# Patient Record
Sex: Male | Born: 2000 | Hispanic: No | Marital: Single | Smoking: Never smoker
Health system: Southern US, Community
[De-identification: ages and names within clinical notes are randomized; demographics above are authoritative.]

## PROBLEM LIST (undated history)

## (undated) ENCOUNTER — Ambulatory Visit (HOSPITAL_COMMUNITY): Source: Home / Self Care

## (undated) DIAGNOSIS — T7840XA Allergy, unspecified, initial encounter: Secondary | ICD-10-CM

---
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
## 2018-01-21 DIAGNOSIS — H10013 Acute follicular conjunctivitis, bilateral: Secondary | ICD-10-CM | POA: Diagnosis not present

## 2018-12-11 DIAGNOSIS — G44209 Tension-type headache, unspecified, not intractable: Secondary | ICD-10-CM | POA: Diagnosis not present

## 2018-12-11 DIAGNOSIS — H40033 Anatomical narrow angle, bilateral: Secondary | ICD-10-CM | POA: Diagnosis not present

## 2018-12-26 DIAGNOSIS — H5213 Myopia, bilateral: Secondary | ICD-10-CM | POA: Diagnosis not present

## 2019-04-18 ENCOUNTER — Encounter (HOSPITAL_COMMUNITY): Payer: Self-pay

## 2019-04-18 ENCOUNTER — Ambulatory Visit (HOSPITAL_COMMUNITY)
Admission: EM | Admit: 2019-04-18 | Discharge: 2019-04-18 | Disposition: A | Discharge status: Home / Self Care | Payer: Medicaid Other | Attending: Family Medicine | Admitting: Family Medicine

## 2019-04-18 ENCOUNTER — Other Ambulatory Visit: Payer: Self-pay

## 2019-04-18 DIAGNOSIS — J302 Other seasonal allergic rhinitis: Secondary | ICD-10-CM

## 2019-04-18 DIAGNOSIS — L700 Acne vulgaris: Secondary | ICD-10-CM

## 2019-04-18 HISTORY — DX: Allergy, unspecified, initial encounter: T78.40XA

## 2019-04-18 MED ORDER — LORATADINE 10 MG PO TABS: 10.0000 mg | ORAL_TABLET | Freq: Every day | ORAL | 0 refills | Status: DC

## 2019-04-18 MED ORDER — CLINDAMYCIN PHOSPHATE 1 % EX SOLN: Freq: Two times a day (BID) | CUTANEOUS | 1 refills | Status: DC

## 2019-04-18 NOTE — ED Triage Notes (Signed)
Pt presents for allergy medication to help  With recurring sneezing and post nasal drainage.

## 2019-04-21 NOTE — ED Provider Notes (Signed)
[facility]   [id number] 04/18/19 Arrival Time: 1141  ASSESSMENT & PLAN:  1. Seasonal allergies   2. Acne vulgaris      Meds ordered this encounter  Medications  . loratadine (CLARITIN) 10 MG tablet    Sig: Take 1 tablet (10 mg total) by mouth daily.    Dispense:  30 tablet    Refill:  0  . clindamycin (CLEOCIN T) 1 % external solution    Sig: Apply topically 2 (two) times daily.    Dispense:  30 mL    Refill:  1    Reviewed expectations re: course of current medical issues. Questions answered. Outlined signs and symptoms indicating need for more acute intervention. Patient verbalized understanding. After Visit Summary given.   SUBJECTIVE: History from: patient. Jared Richardson is a 18 y.o. male who presents requesting medication refill. No current concerns. Seasonal allergies. Claritin works well. Also with facial acne. Requests Rx. No fevers or recent illnesses. No COVID exposures.  Current medical problems include: Past Medical History:  Diagnosis Date  . Allergy           Current Outpatient Medications (Respiratory):  .  loratadine (CLARITIN) 10 MG tablet, Take 1 tablet (10 mg total) by mouth daily.       Current Outpatient Medications (Other):  .  clindamycin (CLEOCIN T) 1 % external solution, Apply topically 2 (two) times daily. No current facility-administered medications for this encounter.  No current facility-administered medications for this encounter.  Current Outpatient Medications:  .  clindamycin (CLEOCIN T) 1 % external solution, Apply topically 2 (two) times daily., Disp: 30 mL, Rfl: 1 .  loratadine (CLARITIN) 10 MG tablet, Take 1 tablet (10 mg total) by mouth daily., Disp: 30 tablet, Rfl: 0   ROS: As per HPI.   OBJECTIVE:  Vitals:   04/18/19 1225  BP: 134/72  Pulse: 75  Resp: 18  Temp: 98.2 F (36.8 C)  TempSrc: Oral  SpO2: 100%    General appearance: alert; no distress Eyes: PERRLA; EOMI; conjunctiva normal  HENT: normocephalic; atraumatic; mild nasal congestion Lungs: clear to auscultation bilaterally Heart: regular rate and rhythm Back: no CVA tenderness Extremities: no cyanosis or edema; symmetrical with no gross deformities Skin: warm and dry; facial acne Neurologic: normal gait; normal symmetric reflexes Psychological: alert and cooperative; normal mood and affect   No Known Allergies  Social History   Socioeconomic History  . Marital status: Single    Spouse name: Not on file  . Number of children: Not on file  . Years of education: Not on file  . Highest education level: Not on file  Occupational History  . Not on file  Tobacco Use  . Smoking status: Never Smoker  . Smokeless tobacco: Never Used  Substance and Sexual Activity  . Alcohol use: No  . Drug use: Not on file  . Sexual activity: Not on file  Other Topics Concern  . Not on file  Social History Narrative  . Not on file   Social Determinants of Health   Financial Resource Strain:   . Difficulty of Paying Living Expenses: Not on file  Food Insecurity:   . Worried About Programme researcher, broadcasting/film/video in the Last Year: Not on file  . Ran Out of Food in the Last Year: Not on file  Transportation Needs:   . Lack of Transportation (Medical): Not on file  . Lack of Transportation (Non-Medical): Not on file  Physical Activity:   . Days of Exercise  per Week: Not on file  . Minutes of Exercise per Session: Not on file  Stress:   . Feeling of Stress : Not on file  Social Connections:   . Frequency of Communication with Friends and Family: Not on file  . Frequency of Social Gatherings with Friends and Family: Not on file  . Attends Religious Services: Not on file  . Active Member of Clubs or Organizations: Not on file  . Attends Archivist Meetings: Not on file  . Marital Status: Not on file  Intimate Partner Violence:   . Fear of Current or Ex-Partner: Not on file  . Emotionally Abused: Not on file  .  Physically Abused: Not on file  . Sexually Abused: Not on file   Family History  Problem Relation Age of Onset  . Hepatitis Mother        chronic active infection   History reviewed. No pertinent surgical history.   Vanessa Kick, MD 04/21/19 [id number]

## 2019-08-17 DIAGNOSIS — H1013 Acute atopic conjunctivitis, bilateral: Secondary | ICD-10-CM | POA: Diagnosis not present

## 2019-09-23 DIAGNOSIS — H04123 Dry eye syndrome of bilateral lacrimal glands: Secondary | ICD-10-CM | POA: Diagnosis not present

## 2019-12-09 ENCOUNTER — Other Ambulatory Visit: Payer: Self-pay

## 2019-12-09 ENCOUNTER — Ambulatory Visit (HOSPITAL_COMMUNITY)
Admission: EM | Admit: 2019-12-09 | Discharge: 2019-12-09 | Disposition: A | Discharge status: Home / Self Care | Payer: Medicaid Other | Attending: Urgent Care | Admitting: Urgent Care

## 2019-12-09 ENCOUNTER — Ambulatory Visit (INDEPENDENT_AMBULATORY_CARE_PROVIDER_SITE_OTHER): Payer: Medicaid Other

## 2019-12-09 DIAGNOSIS — S52572A Other intraarticular fracture of lower end of left radius, initial encounter for closed fracture: Secondary | ICD-10-CM

## 2019-12-09 DIAGNOSIS — S6992XA Unspecified injury of left wrist, hand and finger(s), initial encounter: Secondary | ICD-10-CM

## 2019-12-09 MED ORDER — IBUPROFEN 800 MG PO TABS
ORAL_TABLET | ORAL | Status: AC
  Filled 2019-12-09: qty 1

## 2019-12-09 MED ORDER — IBUPROFEN 800 MG PO TABS: 800.0000 mg | ORAL_TABLET | Freq: Three times a day (TID) | ORAL | 0 refills | Status: DC

## 2019-12-09 MED ORDER — IBUPROFEN 800 MG PO TABS
800.0000 mg | ORAL_TABLET | Freq: Once | ORAL | Status: AC
  Administered 2019-12-09: 21:00:00 800 mg via ORAL

## 2019-12-09 NOTE — ED Triage Notes (Signed)
L wrist deformity from fall off skateboard

## 2019-12-09 NOTE — ED Provider Notes (Signed)
[facility]    CSN: [id number] Arrival date & time: 12/09/19  1854      History   Chief Complaint Chief Complaint  Patient presents with  . Wrist Pain    HPI Jared Richardson is a 19 y.o. male.   [name] presents with complaints of left wrist pain and deformity. He was skate boarding on a ramp, just prior to arrival, fell, and caught himself with his outstretched left arm. He is right handed. Pain since. No previous wrist injury. No numbness or tingling. Pain with flexion and extension. Hasn't taken any medications for pain.   ROS per HPI, negative if not otherwise mentioned.      Past Medical History:  Diagnosis Date  . Allergy     Patient Active Problem List   Diagnosis Date Noted  . Overweight child 05/07/2012  . Exposure to hepatitis B 01/14/2012    No past surgical history on file.     Home Medications    Prior to Admission medications   Medication Sig Start Date End Date Taking? Authorizing Provider  ibuprofen (ADVIL) 800 MG tablet Take 1 tablet (800 mg total) by mouth 3 (three) times daily. 12/09/19   Georgetta Haber, NP    Family History Family History  Problem Relation Age of Onset  . Hepatitis Mother        chronic active infection    Social History Social History   Tobacco Use  . Smoking status: Never Smoker  . Smokeless tobacco: Never Used  Substance Use Topics  . Alcohol use: No  . Drug use: Not on file     Allergies   Patient has no known allergies.   Review of Systems Review of Systems   Physical Exam Triage Vital Signs ED Triage Vitals [12/09/19 2005]  Enc Vitals Group     BP 120/77     Pulse Rate 87     Resp 16     Temp 98.1 F (36.7 C)     Temp src      SpO2 99 %     Weight      Height      Head Circumference      Peak Flow      Pain Score 8     Pain Loc      Pain Edu?      Excl. in [place]?    No data found.  Updated Vital Signs BP 120/77   Pulse 87   Temp 98.1 F (36.7 C)   Resp 16   SpO2 99%     Physical Exam Constitutional:      Appearance: He is well-developed.  Cardiovascular:     Rate and Rhythm: Normal rate.  Pulmonary:     Effort: Pulmonary effort is normal.  Musculoskeletal:     Left wrist: Swelling, deformity, tenderness and bony tenderness present. No effusion, lacerations or crepitus. Decreased range of motion. Normal pulse.     Comments: Swelling and deformity noted to distal radius with tenderness on palpation; cap refill < 2 seconds    Skin:    General: Skin is warm and dry.  Neurological:     Mental Status: He is alert and oriented to person, place, and time.      UC Treatments / Results  Labs (all labs ordered are listed, but only abnormal results are displayed) Labs Reviewed - No data to display  EKG   Radiology DG Wrist Complete Left  Result Date: 12/09/2019 CLINICAL DATA:  Fell off skateboard EXAM: LEFT WRIST - COMPLETE 3+ VIEW COMPARISON:  None. FINDINGS: Acute nondisplaced intra-articular fracture involving the distal radius. No subluxation. Positive for soft tissue swelling. IMPRESSION: Acute nondisplaced intra-articular distal radius fracture. Electronically Signed   By: Jasmine Pang M.D.   On: 12/09/2019 20:28    Procedures Procedures (including critical care time)  Medications Ordered in UC Medications  ibuprofen (ADVIL) tablet 800 mg (800 mg Oral Given 12/09/19 2040)    Initial Impression / Assessment and Plan / UC Course  I have reviewed the triage vital signs and the nursing notes.  Pertinent labs & imaging results that were available during my care of the patient were reviewed by me and considered in my medical decision making (see chart for details).     Splint placed. Ibuprofen provided for mild pain- worse with movement. Neurovascularly intact. Follow up with orthopedics recommended. Patient verbalized understanding and agreeable to plan.   Final Clinical Impressions(s) / UC Diagnoses   Final diagnoses:  Other closed  intra-articular fracture of distal end of left radius, initial encounter     Discharge Instructions     You have a distal radial fracture.  Wear the splint at all times until you are seen by orthopedics.  Ice, elevation, ibuprofen for pain.  Call orthopedics, [facility], on Monday for a follow up appointment in the next two weeks for definitive treatment.    ED Prescriptions    Medication Sig Dispense Auth. Provider   ibuprofen (ADVIL) 800 MG tablet Take 1 tablet (800 mg total) by mouth 3 (three) times daily. 30 tablet Georgetta Haber, NP     PDMP not reviewed this encounter.   Georgetta Haber, NP 12/09/19 2043

## 2019-12-09 NOTE — ED Notes (Signed)
Ortho tech called for splint placement. 

## 2019-12-09 NOTE — Progress Notes (Signed)
Orthopedic Tech Progress Note Patient Details:  Jared Richardson 15-Jan-2001 [id number]  Ortho Devices Type of Ortho Device: Arm sling, Sugartong splint Ortho Device/Splint Location: LUE Ortho Device/Splint Interventions: Application, Adjustment   Post Interventions Patient Tolerated: Well Instructions Provided: Adjustment of device, Care of device   [name] E Marcus Schwandt 12/09/2019, 8:59 PM

## 2019-12-09 NOTE — Discharge Instructions (Signed)
You have a distal radial fracture.  Wear the splint at all times until you are seen by orthopedics.  Ice, elevation, ibuprofen for pain.  Call orthopedics, [facility], on Monday for a follow up appointment in the next two weeks for definitive treatment.

## 2019-12-12 ENCOUNTER — Telehealth (HOSPITAL_COMMUNITY): Payer: Self-pay | Admitting: Internal Medicine

## 2019-12-12 NOTE — Telephone Encounter (Signed)
Patient was having significant difficulty getting follow up from Dr.Gramig's office. Referral was requested. I have placed a referral order in the system so the patient can be seen.

## 2019-12-15 DIAGNOSIS — M25532 Pain in left wrist: Secondary | ICD-10-CM | POA: Diagnosis not present

## 2019-12-15 DIAGNOSIS — S52502A Unspecified fracture of the lower end of left radius, initial encounter for closed fracture: Secondary | ICD-10-CM | POA: Diagnosis not present

## 2020-01-12 DIAGNOSIS — M25532 Pain in left wrist: Secondary | ICD-10-CM | POA: Diagnosis not present

## 2020-01-26 DIAGNOSIS — M25532 Pain in left wrist: Secondary | ICD-10-CM | POA: Diagnosis not present

## 2020-02-03 DIAGNOSIS — M25532 Pain in left wrist: Secondary | ICD-10-CM | POA: Diagnosis not present

## 2020-02-09 DIAGNOSIS — M25532 Pain in left wrist: Secondary | ICD-10-CM | POA: Diagnosis not present

## 2020-05-17 ENCOUNTER — Encounter (HOSPITAL_COMMUNITY): Payer: Self-pay

## 2020-05-17 ENCOUNTER — Other Ambulatory Visit: Payer: Self-pay

## 2020-05-17 ENCOUNTER — Ambulatory Visit (HOSPITAL_COMMUNITY)
Admission: EM | Admit: 2020-05-17 | Discharge: 2020-05-17 | Disposition: A | Discharge status: Home / Self Care | Payer: Medicaid Other | Attending: Student | Admitting: Student

## 2020-05-17 DIAGNOSIS — J029 Acute pharyngitis, unspecified: Secondary | ICD-10-CM | POA: Insufficient documentation

## 2020-05-17 DIAGNOSIS — J069 Acute upper respiratory infection, unspecified: Secondary | ICD-10-CM | POA: Insufficient documentation

## 2020-05-17 DIAGNOSIS — U071 COVID-19: Secondary | ICD-10-CM | POA: Diagnosis not present

## 2020-05-17 LAB — RESP PANEL BY RT-PCR (FLU A&B, COVID) ARPGX2
Influenza A by PCR: NEGATIVE
Influenza B by PCR: NEGATIVE
SARS Coronavirus 2 by RT PCR: POSITIVE — AB

## 2020-05-17 MED ORDER — LIDOCAINE VISCOUS HCL 2 % MT SOLN: 15.0000 mL | OROMUCOSAL | 0 refills | Status: DC | PRN

## 2020-05-17 NOTE — ED Triage Notes (Signed)
Pt is here with a sore throat and headache that started last night, pt has taken OTC meds to relieve discomfort.

## 2020-05-17 NOTE — Discharge Instructions (Addendum)
-  For fevers/chills, body aches, headaches- use Tylenol and Ibuprofen. You can alternate these for maximum effect. Use up to 3000mg  Tylenol daily and 3200mg  Ibuprofen daily. Make sure to take ibuprofen with food. Check the bottle of ibuprofen/tylenol for specific dosage instructions. -For sore throat, use lidocaine mouthwash up to every 4 hours. Make sure not to eat for at least 1 hour after using this, as your mouth will be very numb and you could bite yourself.  We are currently awaiting result of your PCR covid-19 test. This typically comes back in 1-2 days. We'll call you if the result is positive. Otherwise, the result will be sent electronically to your MyChart. You can also call this clinic and ask for your result via telephone.   Please isolate at home while awaiting these results. If your test is positive for Covid-19, continue to isolate at home for 5 days if you have mild symptoms, or a total of 10 days from symptom onset if you have more severe symptoms. If you quarantine for a shorter period of time (i.e. 5 days), make sure to wear a mask until day 10 of symptoms. Treat your symptoms at home with OTC remedies like tylenol/ibuprofen, mucinex, nyquil, etc. Seek medical attention if you develop high fevers, chest pain, shortness of breath, ear pain, facial pain, etc. Make sure to get up and move around every 2-3 hours while convalescing to help prevent blood clots. Drink plenty of fluids, and rest as much as possible.

## 2020-05-17 NOTE — ED Provider Notes (Signed)
[place]    CSN: [id number] Arrival date & time: 05/17/20  3818      History   Chief Complaint Chief Complaint  Patient presents with  . Sore Throat  . Headache    HPI Jared Richardson is a 20 y.o. male Presenting for URI symptoms for 2 days. History of allergies. Presenting with sore throat, cough, and headache. OTC medications with some relief- tylenol, nyquil.  Denies fevers/chills, n/v/d, shortness of breath, chest pain, congestion, facial pain, teeth pain, loss of taste/smell, swollen lymph nodes, ear pain.  Denies worst headache of life, thunderclap headache, weakness/sensation changes in arms/legs, vision changes, shortness of breath, chest pain/pressure, photophobia, phonophobia, n/v/d.  Denies chest pain, shortness of breath, confusion, high fevers.  Fully vaccinated for covid-19.   HPI  Past Medical History:  Diagnosis Date  . Allergy     Patient Active Problem List   Diagnosis Date Noted  . Overweight child 05/07/2012  . Exposure to hepatitis B 01/14/2012    History reviewed. No pertinent surgical history.     Home Medications    Prior to Admission medications   Medication Sig Start Date End Date Taking? Authorizing Provider  lidocaine (XYLOCAINE) 2 % solution Use as directed 15 mLs in the mouth or throat as needed for mouth pain. 05/17/20  Yes Hazel Sams, PA-C  ibuprofen (ADVIL) 800 MG tablet Take 1 tablet (800 mg total) by mouth 3 (three) times daily. 12/09/19   Zigmund Gottron, NP    Family History Family History  Problem Relation Age of Onset  . Hepatitis Mother        chronic active infection  . Healthy Father     Social History Social History   Tobacco Use  . Smoking status: Never Smoker  . Smokeless tobacco: Never Used  Substance Use Topics  . Alcohol use: Not Currently     Allergies   Patient has no known allergies.   Review of Systems Review of Systems  Constitutional: Negative for appetite change, chills and  fever.  HENT: Positive for sore throat. Negative for congestion, ear pain, rhinorrhea, sinus pressure and sinus pain.   Eyes: Negative for redness and visual disturbance.  Respiratory: Positive for cough. Negative for chest tightness, shortness of breath and wheezing.   Cardiovascular: Negative for chest pain and palpitations.  Gastrointestinal: Negative for abdominal pain, constipation, diarrhea, nausea and vomiting.  Genitourinary: Negative for dysuria, frequency and urgency.  Musculoskeletal: Negative for myalgias.  Neurological: Positive for headaches. Negative for dizziness and weakness.  Psychiatric/Behavioral: Negative for confusion.  All other systems reviewed and are negative.    Physical Exam Triage Vital Signs ED Triage Vitals  Enc Vitals Group     BP 05/17/20 1135 120/79     Pulse Rate 05/17/20 1135 84     Resp 05/17/20 1135 17     Temp 05/17/20 1135 99.1 F (37.3 C)     Temp Source 05/17/20 1135 Oral     SpO2 05/17/20 1135 100 %     Weight --      Height --      Head Circumference --      Peak Flow --      Pain Score 05/17/20 1133 0     Pain Loc --      Pain Edu? --      Excl. in [place]? --    No data found.  Updated Vital Signs BP 120/79 (BP Location: Right Arm)   Pulse 84  Temp 99.1 F (37.3 C) (Oral)   Resp 17   SpO2 100%   Visual Acuity Right Eye Distance:   Left Eye Distance:   Bilateral Distance:    Right Eye Near:   Left Eye Near:    Bilateral Near:     Physical Exam Vitals reviewed.  Constitutional:      General: He is not in acute distress.    Appearance: Normal appearance. He is not ill-appearing.  HENT:     Head: Normocephalic and atraumatic.     Right Ear: Hearing, tympanic membrane, ear canal and external ear normal. No swelling or tenderness. There is no impacted cerumen. No mastoid tenderness. Tympanic membrane is not perforated, erythematous, retracted or bulging.     Left Ear: Hearing, tympanic membrane, ear canal and external  ear normal. No swelling or tenderness. There is no impacted cerumen. No mastoid tenderness. Tympanic membrane is not perforated, erythematous, retracted or bulging.     Nose:     Right Sinus: No maxillary sinus tenderness or frontal sinus tenderness.     Left Sinus: No maxillary sinus tenderness or frontal sinus tenderness.     Mouth/Throat:     Mouth: Mucous membranes are moist.     Pharynx: Uvula midline. Posterior oropharyngeal erythema present. No oropharyngeal exudate.     Tonsils: No tonsillar exudate.  Cardiovascular:     Rate and Rhythm: Normal rate and regular rhythm.     Heart sounds: Normal heart sounds.  Pulmonary:     Breath sounds: Normal breath sounds and air entry. No wheezing, rhonchi or rales.  Chest:     Chest wall: No tenderness.  Abdominal:     General: Abdomen is flat. Bowel sounds are normal.     Tenderness: There is no abdominal tenderness. There is no guarding or rebound.  Lymphadenopathy:     Cervical: No cervical adenopathy.  Neurological:     General: No focal deficit present.     Mental Status: He is alert and oriented to person, place, and time.  Psychiatric:        Attention and Perception: Attention and perception normal.        Mood and Affect: Mood and affect normal.        Behavior: Behavior normal. Behavior is cooperative.        Thought Content: Thought content normal.        Judgment: Judgment normal.      UC Treatments / Results  Labs (all labs ordered are listed, but only abnormal results are displayed) Labs Reviewed - No data to display  EKG   Radiology No results found.  Procedures Procedures (including critical care time)  Medications Ordered in UC Medications - No data to display  Initial Impression / Assessment and Plan / UC Course  I have reviewed the triage vital signs and the nursing notes.  Pertinent labs & imaging results that were available during my care of the patient were reviewed by me and considered in my  medical decision making (see chart for details).     -For fevers/chills, body aches, headaches- use Tylenol and Ibuprofen. You can alternate these for maximum effect. Use up to 3000mg  Tylenol daily and 3200mg  Ibuprofen daily. Make sure to take ibuprofen with food. Check the bottle of ibuprofen/tylenol for specific dosage instructions. -For sore throat, use lidocaine mouthwash up to every 4 hours. Make sure not to eat for at least 1 hour after using this, as your mouth will be very numb and  you could bite yourself.  Covid and influenza tests sent today. Patient is fully vaccinated for covid-19. Isolation precautions per CDC guidelines until negative result. Symptomatic relief with OTC Mucinex, Nyquil, etc. Return precautions- new/worsening fevers/chills, shortness of breath, chest pain, abd pain, etc.   Centor score 0, rapid strep deferred.   Final Clinical Impressions(s) / UC Diagnoses   Final diagnoses:  Acute upper respiratory infection     Discharge Instructions     -For fevers/chills, body aches, headaches- use Tylenol and Ibuprofen. You can alternate these for maximum effect. Use up to 3000mg  Tylenol daily and 3200mg  Ibuprofen daily. Make sure to take ibuprofen with food. Check the bottle of ibuprofen/tylenol for specific dosage instructions. -For sore throat, use lidocaine mouthwash up to every 4 hours. Make sure not to eat for at least 1 hour after using this, as your mouth will be very numb and you could bite yourself.  We are currently awaiting result of your PCR covid-19 test. This typically comes back in 1-2 days. We'll call you if the result is positive. Otherwise, the result will be sent electronically to your MyChart. You can also call this clinic and ask for your result via telephone.   Please isolate at home while awaiting these results. If your test is positive for Covid-19, continue to isolate at home for 5 days if you have mild symptoms, or a total of 10 days from symptom  onset if you have more severe symptoms. If you quarantine for a shorter period of time (i.e. 5 days), make sure to wear a mask until day 10 of symptoms. Treat your symptoms at home with OTC remedies like tylenol/ibuprofen, mucinex, nyquil, etc. Seek medical attention if you develop high fevers, chest pain, shortness of breath, ear pain, facial pain, etc. Make sure to get up and move around every 2-3 hours while convalescing to help prevent blood clots. Drink plenty of fluids, and rest as much as possible.     ED Prescriptions    Medication Sig Dispense Auth. Provider   lidocaine (XYLOCAINE) 2 % solution Use as directed 15 mLs in the mouth or throat as needed for mouth pain. 100 mL , PA-C     PDMP not reviewed this encounter.   , PA-C 05/17/20 2033

## 2020-05-19 ENCOUNTER — Telehealth (HOSPITAL_COMMUNITY): Payer: Self-pay

## 2022-01-20 IMAGING — DX DG WRIST COMPLETE 3+V*L*
4 series · 4 of 4 positions shown · non-contrast
Comparison: None.

CLINICAL DATA: Fell off skateboard

EXAM:
LEFT WRIST - COMPLETE 3+ VIEW

[wrist pa]
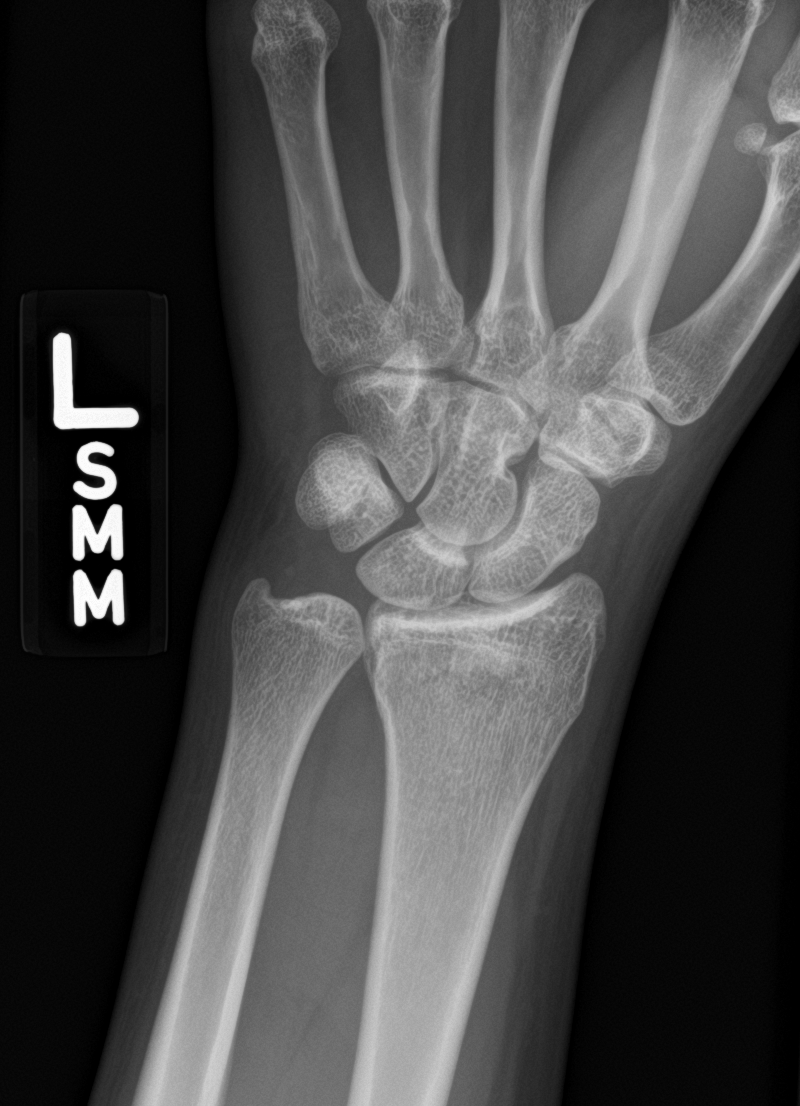

[wrist navicular]
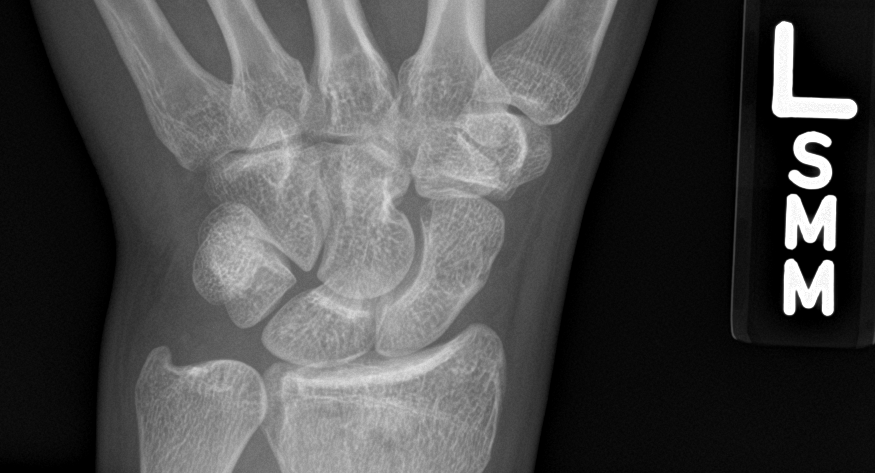

[wrist obl]
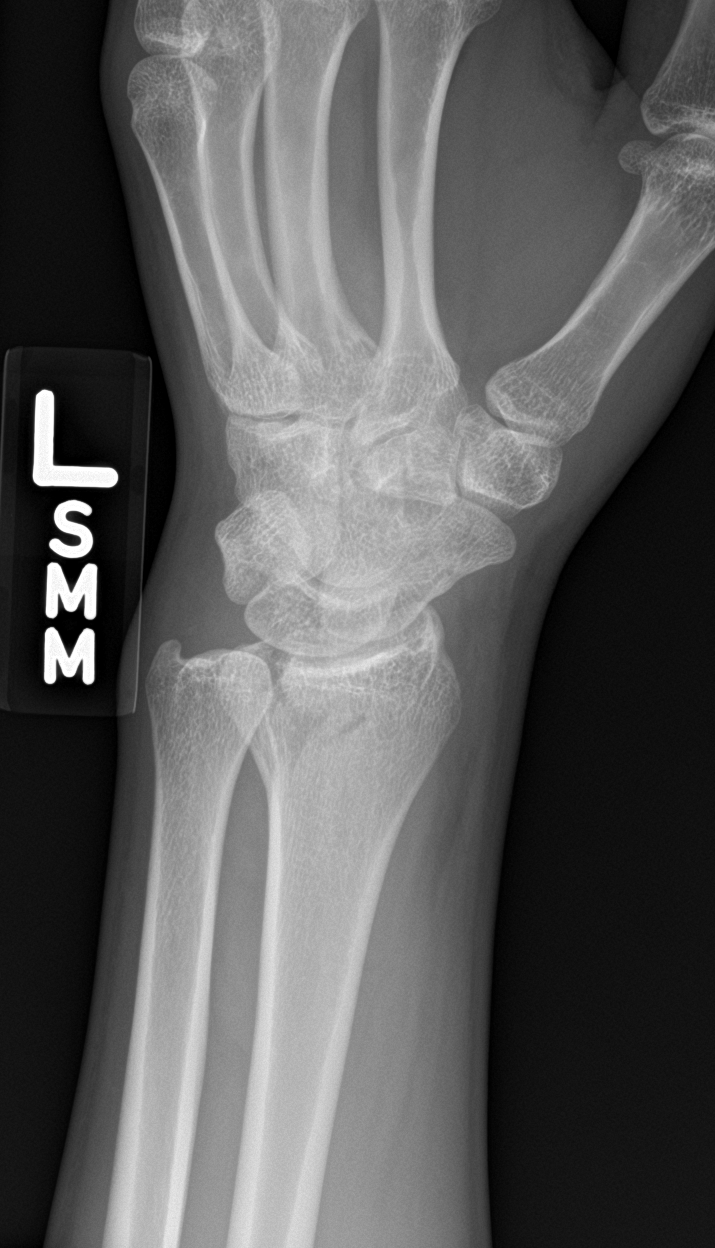

[wrist lat]
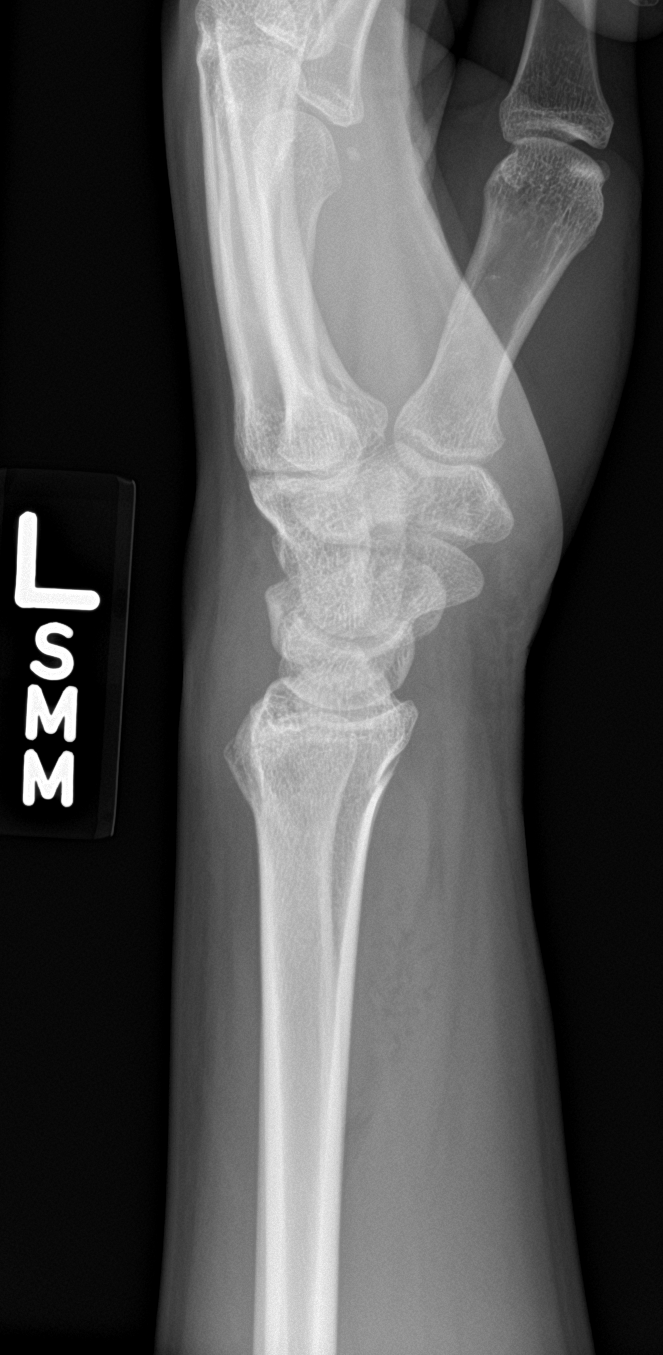

[4 of 4 positions shown; findings below may reference images not displayed]

FINDINGS: Acute nondisplaced intra-articular fracture involving the distal
radius. No subluxation. Positive for soft tissue swelling.
IMPRESSION: Acute nondisplaced intra-articular distal radius fracture.

## 2022-06-19 DIAGNOSIS — H5213 Myopia, bilateral: Secondary | ICD-10-CM | POA: Diagnosis not present

## 2023-01-31 ENCOUNTER — Encounter (HOSPITAL_COMMUNITY): Payer: Self-pay | Admitting: Emergency Medicine

## 2023-01-31 ENCOUNTER — Other Ambulatory Visit: Payer: Self-pay

## 2023-01-31 ENCOUNTER — Ambulatory Visit (HOSPITAL_COMMUNITY)
Admission: EM | Admit: 2023-01-31 | Discharge: 2023-01-31 | Disposition: A | Discharge status: Home / Self Care | Payer: 59

## 2023-01-31 DIAGNOSIS — G43809 Other migraine, not intractable, without status migrainosus: Secondary | ICD-10-CM | POA: Diagnosis not present

## 2023-01-31 NOTE — ED Triage Notes (Signed)
Prior to leaving treatment room, patient reported another concern.  When he washes his hair, a lot falls out.

## 2023-01-31 NOTE — ED Triage Notes (Signed)
Reports headache and vomiting yesterday.  States he "feels better today".  "Louann Sjogren wants me to get a check up" Denies taking any medications.  Reports he drank tea and slept it off.

## 2023-01-31 NOTE — ED Provider Notes (Signed)
[facility]    CSN: [id number] Arrival date & time: 01/31/23  1212      History   Chief Complaint Chief Complaint  Patient presents with   Headache   Letter for School/Work    HPI Jared Richardson is a 22 y.o. male.  Yesterday woke up with a migraine.  He had a couple episodes of vomiting.  He slept it off and had some tea and symptoms fully resolved. No medications taken  He reports history of migraine but does not take medicine for it.  They usually resolve on their own.  He does not have any residual symptoms.  He just needs a note to return to work since he missed yesterday.  Past Medical History:  Diagnosis Date   Allergy     Patient Active Problem List   Diagnosis Date Noted   Overweight child 05/07/2012   Exposure to hepatitis B 01/14/2012    History reviewed. No pertinent surgical history.     Home Medications    Prior to Admission medications   Medication Sig Start Date End Date Taking? Authorizing Provider  ibuprofen (ADVIL) 800 MG tablet Take 1 tablet (800 mg total) by mouth 3 (three) times daily. Patient not taking: Reported on 01/31/2023 12/09/19   Linus Mako B, NP  lidocaine (XYLOCAINE) 2 % solution Use as directed 15 mLs in the mouth or throat as needed for mouth pain. Patient not taking: Reported on 01/31/2023 05/17/20   Rhys Martini, PA-C    Family History Family History  Problem Relation Age of Onset   Hepatitis Mother        chronic active infection   Healthy Father     Social History Social History   Tobacco Use   Smoking status: Never   Smokeless tobacco: Never  Substance Use Topics   Alcohol use: Not Currently   Drug use: Never     Allergies   Patient has no known allergies.   Review of Systems Review of Systems Per HPI  Physical Exam Triage Vital Signs ED Triage Vitals  Encounter Vitals Group     BP 01/31/23 1231 113/77     Systolic BP Percentile --      Diastolic BP Percentile --      Pulse Rate  01/31/23 1231 74     Resp 01/31/23 1231 18     Temp 01/31/23 1231 98.6 F (37 C)     Temp Source 01/31/23 1231 Oral     SpO2 01/31/23 1231 97 %     Weight --      Height --      Head Circumference --      Peak Flow --      Pain Score 01/31/23 1229 0     Pain Loc --      Pain Education --      Exclude from Growth Chart --    No data found.  Updated Vital Signs BP 113/77 (BP Location: Right Arm)   Pulse 74   Temp 98.6 F (37 C) (Oral)   Resp 18   SpO2 97%   Visual Acuity Right Eye Distance:   Left Eye Distance:   Bilateral Distance:    Right Eye Near:   Left Eye Near:    Bilateral Near:     Physical Exam Vitals and nursing note reviewed.  Constitutional:      Appearance: Normal appearance.  HENT:     Mouth/Throat:     Mouth: Mucous membranes are  moist.     Pharynx: Oropharynx is clear.  Eyes:     Conjunctiva/sclera: Conjunctivae normal.  Cardiovascular:     Rate and Rhythm: Normal rate and regular rhythm.     Heart sounds: Normal heart sounds.  Pulmonary:     Effort: Pulmonary effort is normal.     Breath sounds: Normal breath sounds.  Abdominal:     General: Bowel sounds are normal.     Palpations: Abdomen is soft.     Tenderness: There is no abdominal tenderness. There is no guarding or rebound.  Musculoskeletal:        General: Normal range of motion.  Skin:    General: Skin is warm and dry.  Neurological:     Mental Status: He is alert and oriented to person, place, and time.     Sensory: No sensory deficit.     Motor: No weakness.     Coordination: Coordination normal.     Gait: Gait normal.      UC Treatments / Results  Labs (all labs ordered are listed, but only abnormal results are displayed) Labs Reviewed - No data to display  EKG   Radiology No results found.  Procedures Procedures (including critical care time)  Medications Ordered in UC Medications - No data to display  Initial Impression / Assessment and Plan / UC Course   I have reviewed the triage vital signs and the nursing notes.  Pertinent labs & imaging results that were available during my care of the patient were reviewed by me and considered in my medical decision making (see chart for details).  Stable vitals, well appearing, no residual symptoms Return to work note provided Can return to clinic if needed Patient without questions at this time  Final Clinical Impressions(s) / UC Diagnoses   Final diagnoses:  Other migraine without status migrainosus, not intractable   Discharge Instructions   None    ED Prescriptions   None    PDMP not reviewed this encounter.   Elizabeth Paulsen, Lurena Joiner, [place] 01/31/23 1329

## 2023-05-02 ENCOUNTER — Ambulatory Visit (HOSPITAL_COMMUNITY)
Admission: EM | Admit: 2023-05-02 | Discharge: 2023-05-02 | Disposition: A | Discharge status: Home / Self Care | Payer: 59 | Attending: Family Medicine | Admitting: Family Medicine

## 2023-05-02 ENCOUNTER — Ambulatory Visit (INDEPENDENT_AMBULATORY_CARE_PROVIDER_SITE_OTHER): Payer: 59

## 2023-05-02 ENCOUNTER — Other Ambulatory Visit: Payer: Self-pay

## 2023-05-02 DIAGNOSIS — G8929 Other chronic pain: Secondary | ICD-10-CM | POA: Diagnosis not present

## 2023-05-02 DIAGNOSIS — M545 Low back pain, unspecified: Secondary | ICD-10-CM

## 2023-05-02 MED ORDER — IBUPROFEN 800 MG PO TABS: 800.0000 mg | ORAL_TABLET | Freq: Three times a day (TID) | ORAL | 0 refills | Status: DC | PRN

## 2023-05-02 MED ORDER — TIZANIDINE HCL 4 MG PO TABS: 4.0000 mg | ORAL_TABLET | Freq: Three times a day (TID) | ORAL | 0 refills | Status: AC | PRN

## 2023-05-02 NOTE — Discharge Instructions (Addendum)
Your xrays are normal by my review. The radiologist will also read your x-ray, and if their interpretation differs significantly from mine, we will call you.  Take ibuprofen 800 mg--1 tab every 8 hours as needed for pain.   Take tizanidine 4 mg--1 every 8 hours as needed for muscle spasms; this medication can cause dizziness and sleepiness  You can use the QR code/website at the back of the summary paperwork to schedule yourself a new patient appointment with primary care  If this injury/pain is work related, you may should speak to your supervisor at work

## 2023-05-02 NOTE — ED Provider Notes (Signed)
[facility]    CSN: [id number] Arrival date & time: 05/02/23  1408      History   Chief Complaint Chief Complaint  Patient presents with   Back Pain    HPI Jared Richardson is a 22 y.o. male.    Back Pain Here for low back pain.  He did slip down steps and fell onto his back about 2 months ago. Since then, he has been having midline low back pain. States it will go into his legs sometimes at work. No b/b incontinence, and no dysuria/hematuria. No fever or rash.  Has not taken anything at home for the pain    Past Medical History:  Diagnosis Date   Allergy     Patient Active Problem List   Diagnosis Date Noted   Overweight child 05/07/2012   Exposure to hepatitis B 01/14/2012    No past surgical history on file.     Home Medications    Prior to Admission medications   Medication Sig Start Date End Date Taking? Authorizing Provider  ibuprofen (ADVIL) 800 MG tablet Take 1 tablet (800 mg total) by mouth every 8 (eight) hours as needed (pain). 05/02/23  Yes Zenia Resides, MD  tiZANidine (ZANAFLEX) 4 MG tablet Take 1 tablet (4 mg total) by mouth every 8 (eight) hours as needed for muscle spasms. 05/02/23  Yes Zenia Resides, MD    Family History Family History  Problem Relation Age of Onset   Hepatitis Mother        chronic active infection   Healthy Father     Social History Social History   Tobacco Use   Smoking status: Never   Smokeless tobacco: Never  Substance Use Topics   Alcohol use: Not Currently   Drug use: Never     Allergies   Patient has no known allergies.   Review of Systems Review of Systems  Musculoskeletal:  Positive for back pain.     Physical Exam Triage Vital Signs ED Triage Vitals  Encounter Vitals Group     BP 05/02/23 1416 127/77     Systolic BP Percentile --      Diastolic BP Percentile --      Pulse Rate 05/02/23 1416 86     Resp 05/02/23 1416 (!) 22     Temp 05/02/23 1416 98.6 F (37 C)      Temp Source 05/02/23 1416 Oral     SpO2 05/02/23 1416 98 %     Weight 05/02/23 1422 175 lb 9.6 oz (79.7 kg)     Height 05/02/23 1422 5' 7.5" (1.715 m)     Head Circumference --      Peak Flow --      Pain Score 05/02/23 1418 3     Pain Loc --      Pain Education --      Exclude from Growth Chart --    No data found.  Updated Vital Signs BP 127/77 (BP Location: Left Arm)   Pulse 86   Temp 98.6 F (37 C) (Oral)   Resp (!) 22   Ht 5' 7.5" (1.715 m)   Wt 79.7 kg   SpO2 98%   BMI 27.10 kg/m   Visual Acuity Right Eye Distance:   Left Eye Distance:   Bilateral Distance:    Right Eye Near:   Left Eye Near:    Bilateral Near:     Physical Exam Vitals reviewed.  Constitutional:      General:  He is not in acute distress.    Appearance: He is not toxic-appearing.  HENT:     Mouth/Throat:     Mouth: Mucous membranes are moist.     Pharynx: No oropharyngeal exudate or posterior oropharyngeal erythema.  Eyes:     Extraocular Movements: Extraocular movements intact.     Conjunctiva/sclera: Conjunctivae normal.     Pupils: Pupils are equal, round, and reactive to light.  Musculoskeletal:     Comments: There is some paraspinous spasm of the lumbar spine. No deformity, and no rash. SLR is negative bilaterally  Skin:    Coloration: Skin is not jaundiced or pale.  Neurological:     General: No focal deficit present.     Mental Status: He is alert and oriented to person, place, and time.  Psychiatric:        Behavior: Behavior normal.      UC Treatments / Results  Labs (all labs ordered are listed, but only abnormal results are displayed) Labs Reviewed - No data to display  EKG   Radiology DG Lumbar Spine 2-3 Views Result Date: 05/02/2023 CLINICAL DATA:  Low back pain for 2 months. EXAM: LUMBAR SPINE - 2-3 VIEW COMPARISON:  None Available. FINDINGS: There is no evidence of lumbar spine fracture. Alignment is normal. Intervertebral disc spaces are maintained.  IMPRESSION: Negative. Electronically Signed   By: Kennith Center M.D.   On: 05/02/2023 15:09    Procedures Procedures (including critical care time)  Medications Ordered in UC Medications - No data to display  Initial Impression / Assessment and Plan / UC Course  I have reviewed the triage vital signs and the nursing notes.  Pertinent labs & imaging results that were available during my care of the patient were reviewed by me and considered in my medical decision making (see chart for details).     Xrays are normal by my review. He is advised of radiology overread.   Ibuprofen is sent in for pain, and some tizanidine for night time use.  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 The radiologist will also read your x-ray, and if their interpretation differs significantly from mine, we will call you.  Take ibuprofen 800 mg--1 tab every 8 hours as needed for pain.   Take tizanidine 4 mg--1 every 8 hours as needed for muscle spasms; this medication can cause dizziness and sleepiness  You can use the QR code/website at the back of the summary paperwork to schedule yourself a new patient appointment with primary care  If this injury/pain is work related, you may should speak to your supervisor at work      ED Prescriptions     Medication Sig Dispense Auth. Provider   ibuprofen (ADVIL) 800 MG tablet Take 1 tablet (800 mg total) by mouth every 8 (eight) hours as needed (pain). 21 tablet Adylee Leonardo, Janace Aris, MD   tiZANidine (ZANAFLEX) 4 MG tablet Take 1 tablet (4 mg total) by mouth every 8 (eight) hours as needed for muscle spasms. 15 tablet Hema Lanza, Janace Aris, MD      PDMP not reviewed this encounter.   Zenia Resides, MD 05/02/23 [phone]

## 2023-05-02 NOTE — ED Triage Notes (Signed)
Patient arrives with complaints of lower back pain x2 months. Patient states that he did have a fall down some steps 2 months ago as well. Rates pain a 3/10.

## 2023-05-22 ENCOUNTER — Ambulatory Visit (HOSPITAL_COMMUNITY)
Admission: EM | Admit: 2023-05-22 | Discharge: 2023-05-22 | Disposition: A | Discharge status: Home / Self Care | Payer: Medicaid Other | Attending: Emergency Medicine | Admitting: Emergency Medicine

## 2023-05-22 ENCOUNTER — Encounter (HOSPITAL_COMMUNITY): Payer: Self-pay

## 2023-05-22 DIAGNOSIS — J111 Influenza due to unidentified influenza virus with other respiratory manifestations: Secondary | ICD-10-CM

## 2023-05-22 LAB — POCT INFLUENZA A/B
Influenza A, POC: NEGATIVE
Influenza B, POC: NEGATIVE

## 2023-05-22 MED ORDER — GUAIFENESIN 400 MG PO TABS: ORAL_TABLET | ORAL | 0 refills | Status: AC

## 2023-05-22 MED ORDER — PROMETHAZINE-DM 6.25-15 MG/5ML PO SYRP: 5.0000 mL | ORAL_SOLUTION | Freq: Every evening | ORAL | 0 refills | Status: AC | PRN

## 2023-05-22 MED ORDER — ACETAMINOPHEN 325 MG PO TABS
ORAL_TABLET | ORAL | Status: AC
  Filled 2023-05-22: qty 2

## 2023-05-22 MED ORDER — ACETAMINOPHEN 325 MG PO TABS
650.0000 mg | ORAL_TABLET | Freq: Once | ORAL | Status: AC
  Administered 2023-05-22: 650 mg via ORAL

## 2023-05-22 MED ORDER — IBUPROFEN 400 MG PO TABS: 400.0000 mg | ORAL_TABLET | Freq: Three times a day (TID) | ORAL | 0 refills | Status: AC | PRN

## 2023-05-22 NOTE — ED Provider Notes (Signed)
 [facility]    CSN: [id number] Arrival date & time: 05/22/23  1526    HISTORY   Chief Complaint  Patient presents with   Cough   HPI Jared Richardson is a pleasant, 23 y.o. male who presents to urgent care today. Pt c/o cough, sore throat, headache, and nasal congestion x2 days. States took zyrtec this morning w/o meaningful relief.  Patient has a fever on arrival with a heart rate of 107.  States his sister has been sick with similar symptoms but did not seek medical evaluation.  The history is provided by the patient.   Past Medical History:  Diagnosis Date   Allergy    Patient Active Problem List   Diagnosis Date Noted   Overweight child 05/07/2012   Exposure to hepatitis B 01/14/2012   History reviewed. No pertinent surgical history.  Home Medications    Prior to Admission medications   Medication Sig Start Date End Date Taking? Authorizing Provider  ibuprofen  (ADVIL ) 800 MG tablet Take 1 tablet (800 mg total) by mouth every 8 (eight) hours as needed (pain). 05/02/23   Vonna Sharlet POUR, MD  tiZANidine  (ZANAFLEX ) 4 MG tablet Take 1 tablet (4 mg total) by mouth every 8 (eight) hours as needed for muscle spasms. 05/02/23   Vonna Sharlet POUR, MD    Family History Family History  Problem Relation Age of Onset   Hepatitis Mother        chronic active infection   Healthy Father    Social History Social History   Tobacco Use   Smoking status: Never   Smokeless tobacco: Never  Substance Use Topics   Alcohol use: Not Currently   Drug use: Never   Allergies   Patient has no known allergies.  Review of Systems Review of Systems Pertinent findings revealed after performing a 14 point review of systems has been noted in the history of present illness.  Physical Exam Vital Signs BP (!) 136/92 (BP Location: Right Arm)   Pulse (!) 107   Temp (!) 100.7 F (38.2 C) (Oral)   Resp 18   SpO2 98%   No data found.  Physical Exam Vitals and nursing note  reviewed.  Constitutional:      General: He is awake. He is not in acute distress.    Appearance: Normal appearance. He is well-developed and well-groomed. He is ill-appearing.  HENT:     Head: Normocephalic and atraumatic.     Salivary Glands: Right salivary gland is not diffusely enlarged or tender. Left salivary gland is not diffusely enlarged or tender.     Right Ear: Hearing and external ear normal. Tympanic membrane is erythematous.     Left Ear: Hearing and external ear normal. Tympanic membrane is erythematous.     Ears:     Comments: Bilateral EACs with diffuse erythema    Nose: Congestion and rhinorrhea present. Rhinorrhea is clear.     Right Turbinates: Enlarged.     Left Turbinates: Enlarged.     Right Sinus: No maxillary sinus tenderness or frontal sinus tenderness.     Left Sinus: No maxillary sinus tenderness or frontal sinus tenderness.     Mouth/Throat:     Lips: Pink.     Mouth: Mucous membranes are moist.     Pharynx: Uvula midline. Pharyngeal swelling, posterior oropharyngeal erythema and uvula swelling present. No postnasal drip.     Tonsils: No tonsillar exudate. 0 on the right. 0 on the left.  Eyes:  General: Lids are normal.     Pupils: Pupils are equal, round, and reactive to light.  Cardiovascular:     Rate and Rhythm: Regular rhythm. Tachycardia present.     Pulses: Normal pulses.     Heart sounds: Normal heart sounds, S1 normal and S2 normal.  Pulmonary:     Effort: Pulmonary effort is normal. No tachypnea, bradypnea, accessory muscle usage, prolonged expiration or respiratory distress.     Breath sounds: Normal breath sounds and air entry. No stridor, decreased air movement or transmitted upper airway sounds. No decreased breath sounds, wheezing, rhonchi or rales.     Comments: Turbulent breath sounds throughout without wheeze, rale, rhonchi. Abdominal:     General: Abdomen is flat. Bowel sounds are normal.     Palpations: Abdomen is soft.   Musculoskeletal:        General: Normal range of motion.     Cervical back: Full passive range of motion without pain, normal range of motion and neck supple.  Lymphadenopathy:     Cervical: Cervical adenopathy present.     Right cervical: Superficial cervical adenopathy and posterior cervical adenopathy present.     Left cervical: Superficial cervical adenopathy and posterior cervical adenopathy present.  Skin:    General: Skin is warm and dry.  Neurological:     General: No focal deficit present.     Mental Status: He is alert and oriented to person, place, and time.     Motor: Motor function is intact.     Coordination: Coordination is intact.     Gait: Gait is intact.     Deep Tendon Reflexes: Reflexes are normal and symmetric.  Psychiatric:        Attention and Perception: Attention and perception normal.        Mood and Affect: Mood and affect normal.        Speech: Speech normal.        Behavior: Behavior normal. Behavior is cooperative.        Thought Content: Thought content normal.     Visual Acuity Right Eye Distance:   Left Eye Distance:   Bilateral Distance:    Right Eye Near:   Left Eye Near:    Bilateral Near:     UC Couse / Diagnostics / Procedures:     Radiology No results found.  Procedures Procedures (including critical care time) EKG  Pending results:  Labs Reviewed  POCT INFLUENZA A/B    Medications Ordered in UC: Medications  acetaminophen  (TYLENOL ) tablet 650 mg (650 mg Oral Given 05/22/23 1650)    UC Diagnoses / Final Clinical Impressions(s)   I have reviewed the triage vital signs and the nursing notes.  Pertinent labs & imaging results that were available during my care of the patient were reviewed by me and considered in my medical decision making (see chart for details).    Final diagnoses:  Influenza-like illness   Influenza test today is negative.  COVID-19 test deferred based on physical exam findings.  Supportive  medications discussed.  Conservative care recommended.  Return precautions advised.  Please see discharge instructions below for details of plan of care as provided to patient. ED Prescriptions     Medication Sig Dispense Auth. Provider   guaifenesin  (HUMIBID E) 400 MG TABS tablet Take 1 tablet 3 times daily as needed for chest congestion and cough 30 tablet Joesph Shaver Scales, PA-C   promethazine -dextromethorphan (PROMETHAZINE -DM) 6.25-15 MG/5ML syrup Take 5 mLs by mouth at bedtime as needed  for cough. 60 mL Joesph Shaver Scales, PA-C   ibuprofen  (ADVIL ) 400 MG tablet Take 1 tablet (400 mg total) by mouth every 8 (eight) hours as needed for up to 30 doses. 30 tablet Joesph Shaver Scales, PA-C      PDMP not reviewed this encounter.  Pending results:  Labs Reviewed  POCT INFLUENZA A/B      Discharge Instructions      Your influenza test today was negative.  I do believe that you are suffering from an influenza-like illness I recommend the following medications to help alleviate your symptoms:  Advil , Motrin  (ibuprofen ): This is a good anti-inflammatory medication which addresses aches, pains and inflammation of the upper airways that causes sinus and nasal congestion as well as in the lower airways which makes your cough feel tight and sometimes burn.  I recommend that you take between 400 to 600 mg every 6-8 hours as needed.  Please do not take more than 2400 mg of ibuprofen  in a 24-hour period and please do not take high doses of ibuprofen  for more than 3 days in a row as this can lead to stomach ulcers.   Robitussin, Mucinex  (guaifenesin ): This is a daytime expectorant.  This single symptom reliever helps break up chest congestion and loosen up thick nasal drainage making phlegm and drainage easier to cough up and to blow out from your nose.  I recommend taking 400 mg in either liquid or tablet form three times daily as needed.  I do not recommend the 12-hour extended relief  version or doses higher than 400 mg per each dose as these often make some patients feel jittery or jumpy and can interfere with sleep.  I also do not recommend that you purchase guaifenesin  with the ingredient  DM which is dextromethorphan, a cough suppressant which I only recommend taking at bedtime.  Guaifenesin  400 mg is a safe dose for people who are being treated for high blood pressure.     Promethazine  DM: Promethazine  is both a nasal decongestant that dries up mucous membranes and an antinausea medication.  Promethazine  often makes most patients feel fairly sleepy.  DM is dextromethorphan, a single symptom reliever which is a cough suppressant found in many over-the-counter cough medications and combination cold preparations.  Please take 5 mL before bedtime to minimize your cough which will help you sleep better.  I have sent a prescription for this medication to your pharmacy because it cannot be purchased over-the-counter.   If symptoms have not meaningfully improved in the next 5 to 7 days, please return for repeat evaluation or follow-up with your regular provider.  If symptoms have worsened in the next 3 to 5 days, please return for repeat evaluation or follow-up with your regular provider.    Thank you for visiting urgent care today.  We appreciate the opportunity to participate in your care.       Disposition Upon Discharge:  Condition: stable for discharge home  Patient presented with an acute illness with associated systemic symptoms and significant discomfort requiring urgent management. In my opinion, this is a condition that a prudent lay person (someone who possesses an average knowledge of health and medicine) may potentially expect to result in complications if not addressed urgently such as respiratory distress, impairment of bodily function or dysfunction of bodily organs.   Routine symptom specific, illness specific and/or disease specific instructions were discussed  with the patient and/or caregiver at length.   As such, the patient has been  evaluated and assessed, work-up was performed and treatment was provided in alignment with urgent care protocols and evidence based medicine.  Patient/parent/caregiver has been advised that the patient may require follow up for further testing and treatment if the symptoms continue in spite of treatment, as clinically indicated and appropriate.  Patient/parent/caregiver has been advised to return to the [facility] or PCP if no better; to PCP or the Emergency Department if new signs and symptoms develop, or if the current signs or symptoms continue to change or worsen for further workup, evaluation and treatment as clinically indicated and appropriate  The patient will follow up with their current PCP if and as advised. If the patient does not currently have a PCP we will assist them in obtaining one.   The patient may need specialty follow up if the symptoms continue, in spite of conservative treatment and management, for further workup, evaluation, consultation and treatment as clinically indicated and appropriate.  Patient/parent/caregiver verbalized understanding and agreement of plan as discussed.  All questions were addressed during visit.  Please see discharge instructions below for further details of plan.  This office note has been dictated using Teaching laboratory technician.  Unfortunately, this method of dictation can sometimes lead to typographical or grammatical errors.  I apologize for your inconvenience in advance if this occurs.  Please do not hesitate to reach out to me if clarification is needed.      Joesph Shaver Scales, [place] 05/22/23 [phone]

## 2023-05-22 NOTE — Discharge Instructions (Signed)
 Your influenza test today was negative.  I do believe that you are suffering from an influenza-like illness I recommend the following medications to help alleviate your symptoms:  Advil , Motrin  (ibuprofen ): This is a good anti-inflammatory medication which addresses aches, pains and inflammation of the upper airways that causes sinus and nasal congestion as well as in the lower airways which makes your cough feel tight and sometimes burn.  I recommend that you take between 400 to 600 mg every 6-8 hours as needed.  Please do not take more than 2400 mg of ibuprofen  in a 24-hour period and please do not take high doses of ibuprofen  for more than 3 days in a row as this can lead to stomach ulcers.   Robitussin, Mucinex  (guaifenesin ): This is a daytime expectorant.  This single symptom reliever helps break up chest congestion and loosen up thick nasal drainage making phlegm and drainage easier to cough up and to blow out from your nose.  I recommend taking 400 mg in either liquid or tablet form three times daily as needed.  I do not recommend the 12-hour extended relief version or doses higher than 400 mg per each dose as these often make some patients feel jittery or jumpy and can interfere with sleep.  I also do not recommend that you purchase guaifenesin  with the ingredient  DM which is dextromethorphan, a cough suppressant which I only recommend taking at bedtime.  Guaifenesin  400 mg is a safe dose for people who are being treated for high blood pressure.     Promethazine  DM: Promethazine  is both a nasal decongestant that dries up mucous membranes and an antinausea medication.  Promethazine  often makes most patients feel fairly sleepy.  DM is dextromethorphan, a single symptom reliever which is a cough suppressant found in many over-the-counter cough medications and combination cold preparations.  Please take 5 mL before bedtime to minimize your cough which will help you sleep better.  I have sent a  prescription for this medication to your pharmacy because it cannot be purchased over-the-counter.   If symptoms have not meaningfully improved in the next 5 to 7 days, please return for repeat evaluation or follow-up with your regular provider.  If symptoms have worsened in the next 3 to 5 days, please return for repeat evaluation or follow-up with your regular provider.    Thank you for visiting urgent care today.  We appreciate the opportunity to participate in your care.

## 2023-05-22 NOTE — ED Triage Notes (Signed)
 Pt c/o cough, sore throat, headache, and nasal congestion x2 days. States took zyrtec this morning.
